# Patient Record
Sex: Female | Born: 1970 | Race: White | Hispanic: No | Marital: Married | Smoking: Former smoker
Health system: Southern US, Community
[De-identification: ages and names within clinical notes are randomized; demographics above are authoritative.]

---

## 2011-09-16 ENCOUNTER — Encounter: Payer: Self-pay | Admitting: Internal Medicine

## 2011-09-20 ENCOUNTER — Encounter: Payer: Self-pay | Admitting: Internal Medicine

## 2011-09-20 ENCOUNTER — Ambulatory Visit (INDEPENDENT_AMBULATORY_CARE_PROVIDER_SITE_OTHER): Payer: BC Managed Care – PPO | Admitting: Internal Medicine

## 2011-09-20 ENCOUNTER — Other Ambulatory Visit (HOSPITAL_COMMUNITY)
Admission: RE | Admit: 2011-09-20 | Discharge: 2011-09-20 | Disposition: A | Discharge status: Home / Self Care | Payer: BC Managed Care – PPO | Source: Ambulatory Visit | Attending: Internal Medicine | Admitting: Internal Medicine

## 2011-09-20 VITALS — BP 114/70 | HR 84 | Temp 98.2°F | Resp 16 | Ht 63.5 in | Wt 190.5 lb

## 2011-09-20 DIAGNOSIS — Z1239 Encounter for other screening for malignant neoplasm of breast: Secondary | ICD-10-CM

## 2011-09-20 DIAGNOSIS — Z124 Encounter for screening for malignant neoplasm of cervix: Secondary | ICD-10-CM

## 2011-09-20 DIAGNOSIS — Z113 Encounter for screening for infections with a predominantly sexual mode of transmission: Secondary | ICD-10-CM | POA: Insufficient documentation

## 2011-09-20 DIAGNOSIS — Z1159 Encounter for screening for other viral diseases: Secondary | ICD-10-CM | POA: Insufficient documentation

## 2011-09-20 DIAGNOSIS — F329 Major depressive disorder, single episode, unspecified: Secondary | ICD-10-CM

## 2011-09-20 DIAGNOSIS — R5383 Other fatigue: Secondary | ICD-10-CM

## 2011-09-20 DIAGNOSIS — Z13220 Encounter for screening for lipoid disorders: Secondary | ICD-10-CM

## 2011-09-20 DIAGNOSIS — Z01419 Encounter for gynecological examination (general) (routine) without abnormal findings: Secondary | ICD-10-CM | POA: Insufficient documentation

## 2011-09-20 MED ORDER — DESVENLAFAXINE SUCCINATE ER 50 MG PO TB24: 50.0000 mg | ORAL_TABLET | Freq: Every day | ORAL | Status: DC

## 2011-09-20 NOTE — Patient Instructions (Signed)
I am recommending that you start a trial of Pristiq,  One tablet daily for treatment of your depression and anxiety  Please try to add 20 minutes of exercise up to 5 times weekly  For your weight and for your stress  Level.  Your mammogram will be scheduled soon.  Please make an appt to return for a lab visit for fasting labs soon.

## 2011-09-20 NOTE — Progress Notes (Signed)
Subjective:    Patient ID: Holly Walls, female    DOB: [birth year], 40 y.o.   MRN: [id number]  HPI   Review of Systems     Objective:   Physical Exam        Assessment & Plan:   Subjective:     Holly Walls is a 40 y.o. female here for a routine exam.  Current complaints: Describes new onset emotional lability,  Aggravated by stress of job at [employer],  Around the time of her period is the worst.  Went to part time last summer because of the symptoms and has been seeing a Haematologist  But only twice so far. Mother recently diagnosed with cancer,  mother was a victim of domestic violence.  Has early mornign wakenings,  Goes back to sleep after 1 hours  Getting 8 hrs,  Has gained weight.  Due to overeating.  Working 30 hrs ,  Has 3 kids 77, 20, and 41 yr old. Does not exercise but has a treadmill today.  .  Personal health questionnaire reviewed: no.   Gynecologic History Patient's last menstrual period was 08/29/2011. Contraception: condoms Last Pap: 2009. Results were: normal Last mammogram: age 47. Results were: normal  Obstetric History OB History    Grav Para Term Preterm Abortions TAB SAB Ect Mult Living                   The following portions of the patient's history were reviewed and updated as appropriate: allergies, current medications, past family history, past medical history, past social history, past surgical history and problem list.  Review of Systems A comprehensive review of systems was negative except for: Behavioral/Psych: positive for depression and sleep disturbance    Objective:    BP 114/70  Pulse 84  Temp(Src) 98.2 F (36.8 C) (Oral)  Resp 16  Ht 5' 3.5" (1.613 m)  Wt 190 lb 8 oz (86.41 kg)  BMI 33.22 kg/m2  SpO2 100%  LMP 08/29/2011  General Appearance:    Alert, cooperative, no distress, appears stated age  Head:    Normocephalic, without obvious abnormality, atraumatic  Eyes:    PERRL, conjunctiva/corneas clear, EOM's  intact, fundi    benign, both eyes  Ears:    Normal TM's and external ear canals, both ears  Nose:   Nares normal, septum midline, mucosa normal, no drainage    or sinus tenderness  Throat:   Lips, mucosa, and tongue normal; teeth and gums normal  Neck:   Supple, symmetrical, trachea midline, no adenopathy;    thyroid:  no enlargement/tenderness/nodules; no carotid   bruit or JVD  Back:     Symmetric, no curvature, ROM normal, no CVA tenderness  Lungs:     Clear to auscultation bilaterally, respirations unlabored  Chest Wall:    No tenderness or deformity   Heart:    Regular rate and rhythm, S1 and S2 normal, no murmur, rub   or gallop  Breast Exam:    No tenderness, masses, or nipple abnormality  Abdomen:     Soft, non-tender, bowel sounds active all four quadrants,    no masses, no organomegaly  Genitalia:    Normal female without lesion, discharge or tenderness  Rectal:    Normal tone, normal prostate, no masses or tenderness;   guaiac negative stool  Extremities:   Extremities normal, atraumatic, no cyanosis or edema  Pulses:   2+ and symmetric all extremities  Skin:   Skin color, texture, turgor normal,  no rashes or lesions  Lymph nodes:   Cervical, supraclavicular, and axillary nodes normal  Neurologic:   CNII-XII intact, normal strength, sensation and reflexes    throughout      Assessment:    Healthy female exam.    Plan:    Mammogram ordered. Follow up in: 1 month.

## 2011-09-21 ENCOUNTER — Encounter: Payer: Self-pay | Admitting: Internal Medicine

## 2011-09-29 LAB — HM PAP SMEAR: HM Pap smear: NORMAL

## 2011-10-03 ENCOUNTER — Encounter: Payer: Self-pay | Admitting: *Deleted

## 2011-10-07 ENCOUNTER — Encounter: Payer: Self-pay | Admitting: Internal Medicine

## 2011-10-07 ENCOUNTER — Ambulatory Visit (INDEPENDENT_AMBULATORY_CARE_PROVIDER_SITE_OTHER): Payer: BC Managed Care – PPO | Admitting: Internal Medicine

## 2011-10-07 VITALS — BP 110/78 | HR 81 | Temp 98.5°F | Resp 18 | Ht 63.0 in | Wt 187.5 lb

## 2011-10-07 DIAGNOSIS — J019 Acute sinusitis, unspecified: Secondary | ICD-10-CM

## 2011-10-07 DIAGNOSIS — J329 Chronic sinusitis, unspecified: Secondary | ICD-10-CM

## 2011-10-07 LAB — POCT INFLUENZA A/B
Influenza A, POC: NEGATIVE
Influenza B, POC: NEGATIVE

## 2011-10-07 MED ORDER — BENZONATATE 200 MG PO CAPS: 200.0000 mg | ORAL_CAPSULE | Freq: Three times a day (TID) | ORAL | Status: AC | PRN

## 2011-10-07 MED ORDER — AMOXICILLIN-POT CLAVULANATE 875-125 MG PO TABS: 1.0000 | ORAL_TABLET | Freq: Two times a day (BID) | ORAL | Status: AC

## 2011-10-07 NOTE — Progress Notes (Signed)
  Subjective:    Patient ID: Holly Walls, female    DOB: 05/10/1971, 40 y.o.   MRN: [id number]  HPI   Holly Walls is a 40 yr Hispanic female who presents with a 5 day history of severe myalgias, sinus pressure without drainage,  Fevers to 100.3  and productive cough. No wheezing.  Using otc meds  with no significant relief. Daughter and husband are not sick.  Past Medical History  Diagnosis Date  . Asthma    Current Outpatient Prescriptions on File Prior to Visit  Medication Sig Dispense Refill  . albuterol (PROVENTIL HFA;VENTOLIN HFA) 108 (90 BASE) MCG/ACT inhaler Inhale 2 puffs into the lungs every 6 (six) hours as needed.        . desvenlafaxine (PRISTIQ) 50 MG 24 hr tablet Take 1 tablet (50 mg total) by mouth daily.  14 tablet  0    Review of Systems  Constitutional: Negative for fever, chills and unexpected weight change.  HENT: Negative for hearing loss, ear pain, nosebleeds, congestion, sore throat, facial swelling, rhinorrhea, sneezing, mouth sores, trouble swallowing, neck pain, neck stiffness, voice change, postnasal drip, sinus pressure, tinnitus and ear discharge.   Eyes: Negative for pain, discharge, redness and visual disturbance.  Respiratory: Negative for cough, chest tightness, shortness of breath, wheezing and stridor.   Cardiovascular: Negative for chest pain, palpitations and leg swelling.  Musculoskeletal: Negative for myalgias and arthralgias.  Skin: Negative for color change and rash.  Neurological: Negative for dizziness, weakness, light-headedness and headaches.  Hematological: Negative for adenopathy.       Objective:   Physical Exam  Constitutional: She is oriented to person, place, and time. She appears well-developed and well-nourished.  HENT:  Mouth/Throat: Oropharynx is clear and moist.  Eyes: EOM are normal. Pupils are equal, round, and reactive to light. No scleral icterus.  Neck: Normal range of motion. Neck supple. No JVD present. No thyromegaly  present.  Cardiovascular: Normal rate, regular rhythm, normal heart sounds and intact distal pulses.   Pulmonary/Chest: Effort normal and breath sounds normal.  Abdominal: Soft. Bowel sounds are normal. She exhibits no mass. There is no tenderness.  Musculoskeletal: Normal range of motion. She exhibits no edema.  Lymphadenopathy:    She has no cervical adenopathy.  Neurological: She is alert and oriented to person, place, and time.  Skin: Skin is warm and dry.  Psychiatric: She has a normal mood and affect.          Assessment & Plan:

## 2011-10-07 NOTE — Patient Instructions (Signed)
I am treating your for sinusitis with augmentin rx.  Take sudafed PE 10 mg every 6 hours for the congestion  Add tylenol, ibuprofen for  muscle aches and fevers.    Mucinex for thick secretions,  Delsym for cough..  There is a prescription for tessalomn cough tablets on file at your pharmacy if you need stronger cough medicine   Finish all your antibiotics.

## 2011-10-09 DIAGNOSIS — J019 Acute sinusitis, unspecified: Secondary | ICD-10-CM | POA: Insufficient documentation

## 2011-10-09 NOTE — Assessment & Plan Note (Signed)
She has no signs of asthma exacerbation or otitis on exam. Rapid influenza test was negative. Will treat for sinusitis.

## 2011-10-12 ENCOUNTER — Other Ambulatory Visit: Payer: BC Managed Care – PPO

## 2011-11-03 ENCOUNTER — Encounter: Payer: Self-pay | Admitting: Internal Medicine

## 2011-11-03 ENCOUNTER — Ambulatory Visit (INDEPENDENT_AMBULATORY_CARE_PROVIDER_SITE_OTHER): Payer: BC Managed Care – PPO | Admitting: Internal Medicine

## 2011-11-03 VITALS — BP 98/76 | HR 84 | Temp 97.8°F

## 2011-11-03 DIAGNOSIS — M545 Low back pain: Secondary | ICD-10-CM

## 2011-11-03 MED ORDER — PREDNISONE (PAK) 10 MG PO TABS: ORAL_TABLET | ORAL | Status: AC

## 2011-11-03 MED ORDER — HYDROCODONE-ACETAMINOPHEN 5-500 MG PO TABS: 1.0000 | ORAL_TABLET | Freq: Four times a day (QID) | ORAL | Status: DC | PRN

## 2011-11-03 MED ORDER — HYDROCODONE-ACETAMINOPHEN 5-500 MG PO TABS: 1.0000 | ORAL_TABLET | Freq: Four times a day (QID) | ORAL | Status: AC | PRN

## 2011-11-03 MED ORDER — ALBUTEROL SULFATE HFA 108 (90 BASE) MCG/ACT IN AERS: 2.0000 | INHALATION_SPRAY | Freq: Four times a day (QID) | RESPIRATORY_TRACT | Status: DC | PRN

## 2011-11-03 MED ORDER — METHOCARBAMOL 500 MG PO TABS: 500.0000 mg | ORAL_TABLET | Freq: Three times a day (TID) | ORAL | Status: AC

## 2011-11-03 NOTE — Progress Notes (Signed)
  Subjective:    Patient ID: Holly Walls, female    DOB: [birth year], 41 y.o.   MRN: [id number]  HPI    Subjective:    Holly Walls is a 40 y.o. female self-referred for evaluation and treatment of chronic low back pain. This is evaluated as a personal injury. The patient first noted symptoms 4 days ago. It was not related to a fall. The pain is rated moderate, and is located at the right sacroiliac area. The pain is described as stabbing and occurs all day. The symptoms are progressive. Symptoms are exacerbated by extension, lifting, sneezing, standing and twisting. Factors which relieve the pain include rest. Other associated symptoms include no other symptoms. Previous history of symptoms: never.  Treatment efforts have included none.  Outside reports reviewed: {one.  The following portions of the patient's history were reviewed and updated as appropriate: allergies, current medications, past family history, past medical history, past social history, past surgical history and problem list.  Review of Systems A comprehensive review of systems was negative.    Objective:     General :    alert, cooperative and appears stated age  Gait:  Normal. The patient can bear weight on the injured extremity.  Tenderness:   absent  Edema:   absent.  Back ROM:  leg lengths equal with no atrophy noted, flexion limited by pain to 60 degrees  Extension:   -10  Lateral Rotation:  Normal/Normal  Lateral Bending:   10/10  Leg Lengths:   Equal  Atrophy:    is absent  Pulses:  2+ and symmetric  Strength:  abductor 5/5; quadraceps 5/5; hamstrings 5/5; adductors 5/5; iliopsoas 5/5  Straight Leg Raise:  normal/normal  Patellar Reflexes:  2+ bilaterally  Ankle Reflexes:  2+ bilaterally   Imaging none      Assessment:    Low back pain, likely secondary to sacroiliac strain      Plan:    I discussed the following treatment options: Activity modifications discussed and recommended Medication  options discussed and recommended Questions solicited and answered Patient requested to return prn    Review of Systems     Objective:   Physical Exam        Assessment & Plan:  Sacroilieitis.  Right s/i joint

## 2011-11-03 NOTE — Patient Instructions (Signed)
You can take any combination of vicodin, robaxsin and prednisone.  DO NOT take alleve or advil with the prednisone.

## 2011-11-05 ENCOUNTER — Encounter: Payer: Self-pay | Admitting: Internal Medicine

## 2011-12-01 ENCOUNTER — Other Ambulatory Visit (INDEPENDENT_AMBULATORY_CARE_PROVIDER_SITE_OTHER): Payer: BC Managed Care – PPO | Admitting: *Deleted

## 2011-12-01 DIAGNOSIS — Z13220 Encounter for screening for lipoid disorders: Secondary | ICD-10-CM

## 2011-12-01 DIAGNOSIS — R5383 Other fatigue: Secondary | ICD-10-CM

## 2011-12-01 LAB — COMPREHENSIVE METABOLIC PANEL
ALT: 20 U/L (ref 0–35)
AST: 18 U/L (ref 0–37)
Albumin: 3.9 g/dL (ref 3.5–5.2)
Alkaline Phosphatase: 52 U/L (ref 39–117)
BUN: 14 mg/dL (ref 6–23)
Calcium: 9 mg/dL (ref 8.4–10.5)
Chloride: 105 mEq/L (ref 96–112)
Potassium: 4.2 mEq/L (ref 3.5–5.1)
Sodium: 139 mEq/L (ref 135–145)
Total Protein: 7.3 g/dL (ref 6.0–8.3)

## 2011-12-01 LAB — LIPID PANEL
Cholesterol: 138 mg/dL (ref 0–200)
LDL Cholesterol: 80 mg/dL (ref 0–99)
VLDL: 7.8 mg/dL (ref 0.0–40.0)

## 2012-01-16 LAB — HM MAMMOGRAPHY: HM Mammogram: NORMAL

## 2012-01-19 ENCOUNTER — Ambulatory Visit: Payer: Self-pay | Admitting: Internal Medicine

## 2012-02-03 ENCOUNTER — Encounter: Payer: Self-pay | Admitting: Internal Medicine

## 2013-01-14 ENCOUNTER — Other Ambulatory Visit: Payer: Self-pay | Admitting: Internal Medicine

## 2013-04-15 ENCOUNTER — Encounter: Payer: BC Managed Care – PPO | Admitting: Internal Medicine

## 2013-05-27 ENCOUNTER — Other Ambulatory Visit: Payer: Self-pay | Admitting: Internal Medicine

## 2013-05-27 NOTE — Telephone Encounter (Signed)
Last OV 1/13, do you want office visit prior to a refill?

## 2013-05-28 ENCOUNTER — Telehealth: Payer: Self-pay | Admitting: *Deleted

## 2013-05-28 MED ORDER — ALBUTEROL SULFATE HFA 108 (90 BASE) MCG/ACT IN AERS: INHALATION_SPRAY | RESPIRATORY_TRACT | Status: DC

## 2013-05-28 NOTE — Telephone Encounter (Signed)
Patient needs refill on ventolin inhaler no OV since 1/13 called and scheduled appointment for 06/18/13 can we fill inhaler?

## 2013-05-28 NOTE — Telephone Encounter (Signed)
Ok to refill,  Refill sent  

## 2013-05-29 NOTE — Telephone Encounter (Signed)
Left detailed message and reminded patient of appointment,

## 2013-06-10 NOTE — Telephone Encounter (Signed)
The albuterol whould not be withheld but the other can. Not sure which one you were referring to

## 2013-06-18 ENCOUNTER — Encounter: Payer: BC Managed Care – PPO | Admitting: Internal Medicine

## 2013-06-26 ENCOUNTER — Ambulatory Visit (INDEPENDENT_AMBULATORY_CARE_PROVIDER_SITE_OTHER): Payer: BC Managed Care – PPO | Admitting: Internal Medicine

## 2013-06-26 ENCOUNTER — Other Ambulatory Visit (HOSPITAL_COMMUNITY)
Admission: RE | Admit: 2013-06-26 | Discharge: 2013-06-26 | Disposition: A | Discharge status: Home / Self Care | Payer: BC Managed Care – PPO | Source: Ambulatory Visit | Attending: Internal Medicine | Admitting: Internal Medicine

## 2013-06-26 ENCOUNTER — Encounter: Payer: Self-pay | Admitting: Internal Medicine

## 2013-06-26 VITALS — BP 118/84 | HR 53 | Temp 99.0°F | Resp 14 | Ht 63.0 in | Wt 198.8 lb

## 2013-06-26 DIAGNOSIS — R87619 Unspecified abnormal cytological findings in specimens from cervix uteri: Secondary | ICD-10-CM

## 2013-06-26 DIAGNOSIS — E669 Obesity, unspecified: Secondary | ICD-10-CM

## 2013-06-26 DIAGNOSIS — Z111 Encounter for screening for respiratory tuberculosis: Secondary | ICD-10-CM

## 2013-06-26 DIAGNOSIS — Z01419 Encounter for gynecological examination (general) (routine) without abnormal findings: Secondary | ICD-10-CM | POA: Insufficient documentation

## 2013-06-26 DIAGNOSIS — Z201 Contact with and (suspected) exposure to tuberculosis: Secondary | ICD-10-CM

## 2013-06-26 DIAGNOSIS — Z23 Encounter for immunization: Secondary | ICD-10-CM

## 2013-06-26 DIAGNOSIS — Z1239 Encounter for other screening for malignant neoplasm of breast: Secondary | ICD-10-CM

## 2013-06-26 DIAGNOSIS — Z1151 Encounter for screening for human papillomavirus (HPV): Secondary | ICD-10-CM | POA: Insufficient documentation

## 2013-06-26 NOTE — Patient Instructions (Addendum)
You received your TDaP vaccine today   We placed a PPD (skin test to screen for exposure to TB) .  Please return on Friday afternoon to have it read    I want you to lose at least 10 lbs over the next 3 month using diet and exercise.  We will repeat your fasting labs when you return in 3 months   This is  My  version of a  "Low GI"  Diet:  It will still lower your blood sugars and allow you to lose 4 to 8  lbs  per month if you follow it carefully.  Your goal with exercise is a minimum of 30 minutes of aerobic exercise 5 days per week (Walking does not count once it becomes easy!)    All of the foods can be found at grocery stores and in bulk at [employer].  The Atkins protein bars and shakes are available in more varieties at Target, WalMart and [employer]'s Foods.     7 AM Breakfast:  Choose from the following:  Low carbohydrate Protein  Shakes (I recommend the EAS AdvantEdge "Carb Control" shakes  Or the low carb shakes by Atkins.    2.5 carbs   Arnold's "Sandwhich Thin"toasted  w/ peanut butter (no jelly: about 20 net carbs  "Bagel Thin" with cream cheese and salmon: about 20 carbs   a scrambled egg/bacon/cheese burrito made with Mission's "carb balance" whole wheat tortilla  (about 10 net carbs )   Avoid cereal and bananas, oatmeal and cream of wheat and grits. They are loaded with carbohydrates!   10 AM: high protein snack  Protein bar by Atkins (the snack size, under 200 cal, usually < 6 net carbs).    A stick of cheese:  Around 1 carb,  100 cal     Dannon Light n Fit [place] Yogurt  (80 cal, 8 carbs)  Other so called "protein bars" and Greek yogurts tend to be loaded with carbohydrates.  Remember, in food advertising, the word "energy" is synonymous for " carbohydrate."  Lunch:   A Sandwich using the bread choices listed, Can use any  Eggs,  lunchmeat, grilled meat or canned tuna), avocado, regular mayo/mustard  and cheese.  A Salad using blue cheese, ranch,  Goddess or vinagrette,  No  croutons or "confetti" and no "candied nuts" but regular nuts OK.   No pretzels or chips.  Pickles and miniature sweet peppers are a good low carb alternative that provide a "crunch"  The bread is the only source of carbohydrate in a sandwich and  can be decreased by trying some of these alternatives to traditional loaf bread  Joseph's makes a pita bread and a flat bread that are 50 cal and 4 net carbs available at BJs and WalMart.  This can be toasted to use with hummous as well  Toufayan makes a low carb flatbread that's 100 cal and 9 net carbs available at [employer] and [employer] makes 2 sizes of  Low carb whole wheat tortilla  (The large one is 210 cal and 6 net carbs) Avoid "Low fat dressings, as well as Reyne Dumas and [address] dressings They are loaded with sugar!   3 PM/ Mid day  Snack:  Consider  1 ounce of  almonds, walnuts, pistachios, pecans, peanuts,  Macadamia nuts or a nut medley.  Avoid "granola"; the dried cranberries and raisins are loaded with carbohydrates. Mixed nuts as long as there are no raisins,  cranberries  or dried fruit.     6 PM  Dinner:     Meat/fowl/fish with a green salad, and either broccoli, cauliflower, green beans, spinach, brussel sprouts or  Lima beans. DO NOT BREAD THE PROTEIN!!      There is a low carb pasta by Dreamfield's that is acceptable and tastes great: only 5 digestible carbs/serving.( All grocery stores but BJs carry it )  Try Kai Levins Angelo's chicken piccata or chicken or eggplant parm over low carb pasta.(Lowes and BJs)   Clifton Custard Sanchez's "Carnitas" (pulled pork, no sauce,  0 carbs) or his beef pot roast to make a dinner burrito (at BJ's)  Pesto over low carb pasta (bj's sells a good quality pesto in the center refrigerated section of the deli   Whole wheat pasta is still full of digestible carbs and  Not as low in glycemic index as Dreamfield's.   Brown rice is still rice,  So skip the rice and noodles if you eat [place] or [place] (or at  least limit to 1/2 cup)  9 PM snack :   Breyer's "low carb" fudgsicle or  ice cream bar (Carb Smart line), or  Weight Watcher's ice cream bar , or another "no sugar added" ice cream;  a serving of fresh berries/cherries with whipped cream   Cheese or DANNON'S LlGHT N FIT GREEK YOGURT  Avoid bananas, pineapple, grapes  and watermelon on a regular basis because they are high in sugar.  THINK OF THEM AS DESSERT  Remember that snack Substitutions should be less than 10 NET carbs per serving and meals < 20 carbs. Remember to subtract fiber grams to get the "net carbs."

## 2013-06-26 NOTE — Progress Notes (Signed)
Patient ID: Holly Walls, female   DOB: [birth year], 42 y.o.   MRN: [id number]     Subjective:     Holly Walls is a 42 y.o. female and is here for a comprehensive physical exam. The patient reports inability to lose weight .  History   Social History  . Marital Status: Married    Spouse Name: N/A    Number of Children: N/A  . Years of Education: N/A   Occupational History  . Not on file.   Social History Main Topics  . Smoking status: Former Games developer  . Smokeless tobacco: Never Used  . Alcohol Use: Yes  . Drug Use: No  . Sexual Activity: Yes    Birth Control/ Protection: Condom   Other Topics Concern  . Not on file   Social History Narrative  . No narrative on file   Health Maintenance  Topic Date Due  . Influenza Vaccine  05/31/2013  . Pap Smear  09/19/2014  . Tetanus/tdap  06/27/2023    The following portions of the patient's history were reviewed and updated as appropriate: allergies, current medications, past family history, past medical history, past social history, past surgical history and problem list.  Review of Systems A comprehensive review of systems was negative.   Objective:   BP 118/84  Pulse 53  Temp(Src) 99 F (37.2 C) (Oral)  Resp 14  Ht 5\' 3"  (1.6 m)  Wt 198 lb 12 oz (90.152 kg)  BMI 35.22 kg/m2  SpO2 99%  LMP 06/06/2013 General Appearance:    Alert, cooperative, no distress, appears stated age  Head:    Normocephalic, without obvious abnormality, atraumatic  Eyes:    PERRL, conjunctiva/corneas clear, EOM's intact, fundi    benign, both eyes  Ears:    Normal TM's and external ear canals, both ears  Nose:   Nares normal, septum midline, mucosa normal, no drainage    or sinus tenderness  Throat:   Lips, mucosa, and tongue normal; teeth and gums normal  Neck:   Supple, symmetrical, trachea midline, no adenopathy;    thyroid:  no enlargement/tenderness/nodules; no carotid   bruit or JVD  Back:     Symmetric, no curvature, ROM normal,  no CVA tenderness  Lungs:     Clear to auscultation bilaterally, respirations unlabored  Chest Wall:    No tenderness or deformity   Heart:    Regular rate and rhythm, S1 and S2 normal, no murmur, rub   or gallop  Breast Exam:    No tenderness, masses, or nipple abnormality.  Irregularly bordered mole on left breast  Abdomen:     Soft, non-tender, bowel sounds active all four quadrants,    no masses, no organomegaly  Genitalia:    Pelvic: cervix normal in appearance, external genitalia normal, no adnexal masses or tenderness, no cervical motion tenderness, rectovaginal septum normal, uterus normal size, shape, and consistency and vagina normal without malodorous discharge  Extremities:   Extremities normal, atraumatic, no cyanosis or edema  Pulses:   2+ and symmetric all extremities  Skin:   Skin color, texture, turgor normal, no rashes or lesions  Lymph nodes:   Cervical, supraclavicular, and axillary nodes normal  Neurologic:   CNII-XII intact, normal strength, sensation and reflexes    throughout     Assessment:   Abnormal Pap smear of cervix We are continuing annual Pap smears until she has had 3 consecutive normal results  Obesity, unspecified I have addressed  BMI and recommended wt loss  of 10% of body weigh over the next 6 months using a low glycemic index diet and regular exercise a minimum of 5 days per week.    Exposure to TB She has occupational exposure to undocumented aliens who have been reportedly diagnosed with TB. Screening PPD has been placed and she will return on Friday for reading of TB skin test.  We discussed that if her PPD was positive and chest x-ray were normal according to CDC guidelines she would receive 6-9 months of isoniazid therapy.    Updated Medication List Outpatient Encounter Prescriptions as of 06/26/2013  Medication Sig Dispense Refill  . albuterol (VENTOLIN HFA) 108 (90 BASE) MCG/ACT inhaler INHALE 2 PUFFS BY MOUTH EVERY 6 HOURS AS NEEDED  18 g   3  . [DISCONTINUED] desvenlafaxine (PRISTIQ) 50 MG 24 hr tablet Take 1 tablet (50 mg total) by mouth daily.  14 tablet  0   No facility-administered encounter medications on file as of 06/26/2013.

## 2013-06-28 ENCOUNTER — Encounter: Payer: Self-pay | Admitting: Internal Medicine

## 2013-06-28 DIAGNOSIS — Z201 Contact with and (suspected) exposure to tuberculosis: Secondary | ICD-10-CM | POA: Insufficient documentation

## 2013-06-28 DIAGNOSIS — E669 Obesity, unspecified: Secondary | ICD-10-CM | POA: Insufficient documentation

## 2013-06-28 NOTE — Assessment & Plan Note (Signed)
She has occupational exposure to undocumented aliens who have been reportedly diagnosed with TB. Screening PPD has been placed and she will return on Friday for reading of TB skin test.  We discussed that if her PPD was positive and chest x-ray were normal according to CDC guidelines she would receive 6-9 months of isoniazid therapy.

## 2013-06-28 NOTE — Assessment & Plan Note (Signed)
I have addressed  BMI and recommended wt loss of 10% of body weigh over the next 6 months using a low glycemic index diet and regular exercise a minimum of 5 days per week.   

## 2013-06-28 NOTE — Assessment & Plan Note (Signed)
We are continuing annual Pap smears until she has had 3 consecutive normal results

## 2013-07-03 ENCOUNTER — Encounter: Payer: Self-pay | Admitting: *Deleted

## 2013-09-18 ENCOUNTER — Ambulatory Visit: Payer: BC Managed Care – PPO | Admitting: Internal Medicine

## 2013-09-30 ENCOUNTER — Encounter (INDEPENDENT_AMBULATORY_CARE_PROVIDER_SITE_OTHER): Payer: Self-pay

## 2013-09-30 ENCOUNTER — Encounter: Payer: Self-pay | Admitting: Adult Health

## 2013-09-30 ENCOUNTER — Ambulatory Visit (INDEPENDENT_AMBULATORY_CARE_PROVIDER_SITE_OTHER): Payer: BC Managed Care – PPO | Admitting: Adult Health

## 2013-09-30 VITALS — BP 128/80 | HR 80 | Temp 97.8°F | Resp 14 | Wt 204.0 lb

## 2013-09-30 DIAGNOSIS — N390 Urinary tract infection, site not specified: Secondary | ICD-10-CM

## 2013-09-30 DIAGNOSIS — R300 Dysuria: Secondary | ICD-10-CM | POA: Insufficient documentation

## 2013-09-30 LAB — POCT URINALYSIS DIPSTICK
Bilirubin, UA: NEGATIVE
Glucose, UA: NEGATIVE
Nitrite, UA: NEGATIVE
Urobilinogen, UA: 0.2
pH, UA: 7

## 2013-09-30 MED ORDER — CIPROFLOXACIN HCL 250 MG PO TABS: 250.0000 mg | ORAL_TABLET | Freq: Two times a day (BID) | ORAL | Status: DC

## 2013-09-30 MED ORDER — FLUTICASONE PROPIONATE 50 MCG/ACT NA SUSP: 2.0000 | Freq: Every day | NASAL | Status: AC

## 2013-09-30 NOTE — Progress Notes (Signed)
   Subjective:    Patient ID: Holly Walls, female    DOB: 23-Jan-1971, 42 y.o.   MRN: [id number]  HPI  Pt is a pleasant 42 y/o female who presents to clinic with dysuria x 1-2 days. She denies fever or chills, hematuria   Review of Systems Positive for dysuria Negative for fever, chills, hematuria or flank pain    Objective:   Physical Exam  Constitutional: She is oriented to person, place, and time. No distress.  Cardiovascular: Normal rate and regular rhythm.   Pulmonary/Chest: Effort normal. No respiratory distress.  Abdominal: Soft.  Neurological: She is alert and oriented to person, place, and time.  Psychiatric: She has a normal mood and affect. Her behavior is normal. Thought content normal.          Assessment & Plan:

## 2013-09-30 NOTE — Progress Notes (Signed)
Pre visit review using our clinic review tool, if applicable. No additional management support is needed unless otherwise documented below in the visit note. 

## 2013-09-30 NOTE — Patient Instructions (Signed)
   The urinary tract is the group of organs in the body that handle urine. The urinary tract includes the:   Kidneys, two bean-shaped organs that filter the blood to make urine  Bladder, a balloon-shaped organ that stores urine  Ureters, two tubes that carry urine from the kidneys to the bladder  Urethra, the tube that carries urine from the bladder to the outside of the body   What are urinary tract infections? - Urinary tract infections, also called "UTIs," are infections that affect either the bladder or the kidneys. Bladder infections are more common than kidney infections. Bladder infections happen when bacteria get into the urethra and travel up into the bladder. Kidney infections happen when the bacteria travel even higher, up into the kidneys. Both bladder and kidney infections are more common in women than men.   What are the symptoms of a bladder infection? - The symptoms include:   Pain or a burning feeling when you urinate  The need to urinate often  The need to urinate suddenly or in a hurry  Blood in the urine   What are the symptoms of a kidney infection? - The symptoms of a kidney infection can include the symptoms of a bladder infection, but kidney infections can also cause:   Fever  Back pain  Nausea or vomiting   How are urinary tract infections treated? - Most urinary tract infections are treated with antibiotic pills. These pills work by killing the germs that cause the infection.  If you have a bladder infection, you will probably need to take antibiotics for 3 to 7 days. If you have a kidney infection, you will probably need to take antibiotics for longer-maybe for up to 2 weeks. If you have a kidney infection, it's also possible you will need to be treated in the hospital.   Your symptoms should begin to improve within a day of starting antibiotics. But you should finish all the antibiotic pills you get. Otherwise your infection might come back.  If needed, you  can also take a medicine to numb your bladder such as AZO or Urostat which are sold over the counter.   Can cranberry juice or other cranberry products prevent bladder infections? - The studies suggesting that cranberry products prevent bladder infections are not very good. But if you want to try cranberry products for this purpose, there is probably not much harm in doing so.    

## 2013-09-30 NOTE — Assessment & Plan Note (Signed)
Cipro 250 bid x 3 days.

## 2013-10-02 LAB — URINE CULTURE: Colony Count: >=100000

## 2014-01-17 ENCOUNTER — Ambulatory Visit (INDEPENDENT_AMBULATORY_CARE_PROVIDER_SITE_OTHER)
Admission: RE | Admit: 2014-01-17 | Discharge: 2014-01-17 | Disposition: A | Discharge status: Home / Self Care | Payer: BC Managed Care – PPO | Source: Ambulatory Visit | Attending: Adult Health | Admitting: Adult Health

## 2014-01-17 ENCOUNTER — Encounter (INDEPENDENT_AMBULATORY_CARE_PROVIDER_SITE_OTHER): Payer: Self-pay

## 2014-01-17 ENCOUNTER — Ambulatory Visit (INDEPENDENT_AMBULATORY_CARE_PROVIDER_SITE_OTHER): Payer: BC Managed Care – PPO | Admitting: Adult Health

## 2014-01-17 ENCOUNTER — Encounter: Payer: Self-pay | Admitting: Adult Health

## 2014-01-17 VITALS — BP 108/80 | HR 69 | Temp 98.7°F | Resp 14 | Wt 198.0 lb

## 2014-01-17 DIAGNOSIS — M79671 Pain in right foot: Secondary | ICD-10-CM

## 2014-01-17 DIAGNOSIS — M79609 Pain in unspecified limb: Secondary | ICD-10-CM

## 2014-01-17 NOTE — Progress Notes (Signed)
Patient ID: Holly Walls, female   DOB: [birth year], 43 y.o.   MRN: [id number]    Subjective:    Patient ID: Holly Brooklynracy Yepez, female    DOB: [birth year], 43 y.o.   MRN: [id number]  HPI Pt is a 43 y/o female who presents to clinic with right heel pain x 3 weeks. Pain is worse in the evening. Worse with flat shoes. Reports only hurts in a particular spot on her heel. No other pain. Does not hurt during elliptical. Hurts with walking.   Past Medical History  Diagnosis Date  . Asthma     Current Outpatient Prescriptions on File Prior to Visit  Medication Sig Dispense Refill  . albuterol (VENTOLIN HFA) 108 (90 BASE) MCG/ACT inhaler INHALE 2 PUFFS BY MOUTH EVERY 6 HOURS AS NEEDED  18 g  3  . fluticasone (FLONASE) 50 MCG/ACT nasal spray Place 2 sprays into both nostrils daily.  16 g  6   No current facility-administered medications on file prior to visit.     Review of Systems  Musculoskeletal:       Right heel pain  All other systems reviewed and are negative.       Objective:  BP 108/80  Pulse 69  Temp(Src) 98.7 F (37.1 C) (Oral)  Resp 14  Wt 198 lb (89.812 kg)  SpO2 97%   Physical Exam  Constitutional: She is oriented to person, place, and time. No distress.  Cardiovascular: Normal rate and regular rhythm.   Pulmonary/Chest: Effort normal. No respiratory distress.  Musculoskeletal: Normal range of motion. She exhibits tenderness. She exhibits no edema.  Pinpoint tenderness of the right heel. Pain with weight bearing. Worse by the end of the day. No other pain elsewhere on her foot.  Neurological: She is alert and oriented to person, place, and time.  Skin: Skin is warm and dry.  Psychiatric: She has a normal mood and affect. Her behavior is normal. Thought content normal.       Assessment & Plan:   1. Pain of right heel Pain is really not consistent with plantar fasciitis. ?bone spur. Will send for xray. She is allergic to ASA and does not take any ibuprofen either.  Has been taking tylenol. May need referral to podiatry. Will determine after xray.  - DG Foot Complete Right; Future

## 2014-01-17 NOTE — Progress Notes (Signed)
Pre visit review using our clinic review tool, if applicable. No additional management support is needed unless otherwise documented below in the visit note. 

## 2014-12-30 ENCOUNTER — Other Ambulatory Visit: Payer: Self-pay | Admitting: Internal Medicine

## 2015-02-13 ENCOUNTER — Emergency Department
Admit: 2015-02-13 | Disposition: A | Discharge status: Home / Self Care | Payer: Self-pay | Admitting: Emergency Medicine

## 2015-02-13 ENCOUNTER — Telehealth: Payer: Self-pay | Admitting: Internal Medicine

## 2015-02-13 LAB — CBC
HCT: 37.7 % (ref 35.0–47.0)
HGB: 12.2 g/dL (ref 12.0–16.0)
MCH: 27.4 pg (ref 26.0–34.0)
MCHC: 32.4 g/dL (ref 32.0–36.0)
MCV: 85 fL (ref 80–100)
PLATELETS: 257 10*3/uL (ref 150–440)
RBC: 4.45 10*6/uL (ref 3.80–5.20)
RDW: 14.2 % (ref 11.5–14.5)
WBC: 11.7 10*3/uL — AB (ref 3.6–11.0)

## 2015-02-13 LAB — URINALYSIS, COMPLETE
BLOOD: NEGATIVE
Bacteria: NONE SEEN
Bilirubin,UR: NEGATIVE
Glucose,UR: NEGATIVE mg/dL (ref 0–75)
Ketone: NEGATIVE
Nitrite: NEGATIVE
PH: 6 (ref 4.5–8.0)
Protein: NEGATIVE
Specific Gravity: 1.02 (ref 1.003–1.030)

## 2015-02-13 LAB — PRO B NATRIURETIC PEPTIDE: B-Type Natriuretic Peptide: 26 pg/mL

## 2015-02-13 LAB — BASIC METABOLIC PANEL
Anion Gap: 4 — ABNORMAL LOW (ref 7–16)
BUN: 8 mg/dL
CALCIUM: 8.9 mg/dL
Chloride: 105 mmol/L
Co2: 27 mmol/L
Creatinine: 0.59 mg/dL
EGFR (African American): >60
EGFR (Non-African Amer.): >60
GLUCOSE: 104 mg/dL — AB
Potassium: 3.5 mmol/L
SODIUM: 136 mmol/L

## 2015-02-13 LAB — PREGNANCY, URINE: PREGNANCY TEST, URINE: NEGATIVE m[IU]/mL

## 2015-02-13 LAB — TROPONIN I: Troponin-I: <0.03 ng/mL

## 2015-02-13 NOTE — Telephone Encounter (Signed)
Agree with ER disposition  Could be PE (blood clot in lung) ,  Gallbladder or pancreatitis

## 2015-02-13 NOTE — Telephone Encounter (Signed)
Patient Name: Holly Walls DOB: [birth year] Initial Comment Caller states woke up w/chest pain. SOB; Nurse Assessment Nurse: Elijah Birkaldwell, RN, Lynda Date/Time (Eastern Time): 02/13/2015 1:08:30 PM Confirm and document reason for call. If symptomatic, describe symptoms. ---Caller states she woke up w/chest pain on left side. SOB. It hurts more when she coughs or takes a deep breath. Has asthma/ allergies. Has the patient traveled out of the country within the last 30 days? ---Not Applicable Does the patient require triage? ---Yes Related visit to physician within the last 2 weeks? ---No Does the PT have any chronic conditions? (i.e. diabetes, asthma, etc.) ---Yes List chronic conditions. ---allergies, asthma Did the patient indicate they were pregnant? ---No Guidelines Guideline Title Affirmed Question Affirmed Notes Chest Pain Pain also present in shoulder(s) or arm(s) or jaw (Exception: pain is clearly made worse by movement) Final Disposition User Go to ED Now Elijah Birkaldwell, RN, Lynda Comments Caller states her chest pain is sharp at times & goes in to her shoulder as well. Asked about going to an UC, but nurse strongly advised that she be seen ASAP in the ER.

## 2015-02-13 NOTE — Telephone Encounter (Signed)
FYI

## 2015-04-24 ENCOUNTER — Other Ambulatory Visit: Payer: Self-pay | Admitting: Family Medicine

## 2015-04-24 DIAGNOSIS — Z1231 Encounter for screening mammogram for malignant neoplasm of breast: Secondary | ICD-10-CM

## 2015-05-12 ENCOUNTER — Ambulatory Visit: Payer: Self-pay

## 2015-05-12 ENCOUNTER — Ambulatory Visit
Admission: RE | Admit: 2015-05-12 | Discharge: 2015-05-12 | Disposition: A | Discharge status: Home / Self Care | Payer: No Typology Code available for payment source | Source: Ambulatory Visit | Attending: Family Medicine | Admitting: Family Medicine

## 2015-05-12 DIAGNOSIS — Z1231 Encounter for screening mammogram for malignant neoplasm of breast: Secondary | ICD-10-CM | POA: Insufficient documentation

## 2015-05-23 ENCOUNTER — Encounter: Payer: Self-pay | Admitting: Emergency Medicine

## 2015-05-23 ENCOUNTER — Emergency Department
Admission: EM | Admit: 2015-05-23 | Discharge: 2015-05-23 | Disposition: A | Discharge status: Home / Self Care | Payer: No Typology Code available for payment source | Attending: Emergency Medicine | Admitting: Emergency Medicine

## 2015-05-23 DIAGNOSIS — J029 Acute pharyngitis, unspecified: Secondary | ICD-10-CM | POA: Diagnosis not present

## 2015-05-23 DIAGNOSIS — Z87891 Personal history of nicotine dependence: Secondary | ICD-10-CM | POA: Insufficient documentation

## 2015-05-23 DIAGNOSIS — H6091 Unspecified otitis externa, right ear: Secondary | ICD-10-CM | POA: Insufficient documentation

## 2015-05-23 DIAGNOSIS — Z792 Long term (current) use of antibiotics: Secondary | ICD-10-CM | POA: Diagnosis not present

## 2015-05-23 DIAGNOSIS — H9201 Otalgia, right ear: Secondary | ICD-10-CM | POA: Diagnosis present

## 2015-05-23 MED ORDER — CIPROFLOXACIN HCL 500 MG PO TABS
500.0000 mg | ORAL_TABLET | Freq: Once | ORAL | Status: AC
  Administered 2015-05-23: 500 mg via ORAL
  Filled 2015-05-23: qty 1

## 2015-05-23 MED ORDER — PENICILLIN G BENZATHINE 1200000 UNIT/2ML IM SUSP
1.2000 10*6.[IU] | Freq: Once | INTRAMUSCULAR | Status: AC
  Administered 2015-05-23: 1.2 10*6.[IU] via INTRAMUSCULAR
  Filled 2015-05-23: qty 2

## 2015-05-23 MED ORDER — TRAMADOL HCL 50 MG PO TABS
50.0000 mg | ORAL_TABLET | Freq: Once | ORAL | Status: AC
  Administered 2015-05-23: 50 mg via ORAL
  Filled 2015-05-23: qty 1

## 2015-05-23 MED ORDER — CIPROFLOXACIN HCL 500 MG PO TABS: 500.0000 mg | ORAL_TABLET | Freq: Four times a day (QID) | ORAL | Status: AC

## 2015-05-23 MED ORDER — TRAMADOL HCL 50 MG PO TABS: 50.0000 mg | ORAL_TABLET | Freq: Four times a day (QID) | ORAL | Status: AC | PRN

## 2015-05-23 NOTE — Discharge Instructions (Signed)
Draining Ear Ear wax, pus, blood and other fluids are examples of the different types of drainage from ears. Drops or cream may be needed to lessen the itching which may occur with ear drainage. CAUSES   Skin irritations in the ear.  Ear infection.  Swimmer's ear.  Ruptured eardrum.  Foreign object in the ear canal.  Sudden pressure changes.  Head injury. HOME CARE INSTRUCTIONS   Only take over-the-counter or prescription medicines for pain, fever, or discomfort as directed by your caregiver.  Do not rub the ear canal with cotton-tipped swabs.  Do not swim until your caregiver says it is okay.  Before you take a shower, cover a cotton ball with petroleum jelly to keep water out.  Limit exposure to smoke. Secondhand smoke can increase the chance for ear infections.  Keep up with immunizations.  Wash your hands well.  Keep all follow-up appointments to examine the ear and evaluate hearing. SEEK MEDICAL CARE IF:   You have increased drainage.  You have ear pain, a fever, or drainage that is not getting better after 48 hours of antibiotics.  You are unusually tired. SEEK IMMEDIATE MEDICAL CARE IF:  You have severe ear pain or headache.  The patient is older than 3 months with a rectal or oral temperature of 102 F (38.9 C) or higher.  The patient is 49 months old or younger with a rectal temperature of 100.4 F (38 C) or higher.  You vomit.  You feel dizzy.  You have a seizure.  You have new hearing loss. MAKE SURE YOU:   Understand these instructions.  Will watch your condition.  Will get help right away if you are not doing well or get worse. Document Released: 10/17/2005 Document Revised: 01/09/2012 Document Reviewed: 08/20/2009 [facility] Patient Information 2015 [place], [place]. This information is not intended to replace advice given to you by your health care provider. Make sure you discuss any questions you have with your health care  provider.  Otitis Externa Otitis externa is a bacterial or fungal infection of the outer ear canal. This is the area from the eardrum to the outside of the ear. Otitis externa is sometimes called "swimmer's ear." CAUSES  Possible causes of infection include:  Swimming in dirty water.  Moisture remaining in the ear after swimming or bathing.  Mild injury (trauma) to the ear.  Objects stuck in the ear (foreign body).  Cuts or scrapes (abrasions) on the outside of the ear. SIGNS AND SYMPTOMS  The first symptom of infection is often itching in the ear canal. Later signs and symptoms may include swelling and redness of the ear canal, ear pain, and yellowish-white fluid (pus) coming from the ear. The ear pain may be worse when pulling on the earlobe. DIAGNOSIS  Your health care provider will perform a physical exam. A sample of fluid may be taken from the ear and examined for bacteria or fungi. TREATMENT  Antibiotic ear drops are often given for 10 to 14 days. Treatment may also include pain medicine or corticosteroids to reduce itching and swelling. HOME CARE INSTRUCTIONS   Apply antibiotic ear drops to the ear canal as prescribed by your health care provider.  Take medicines only as directed by your health care provider.  If you have diabetes, follow any additional treatment instructions from your health care provider.  Keep all follow-up visits as directed by your health care provider. PREVENTION   Keep your ear dry. Use the corner of a towel to absorb  water out of the ear canal after swimming or bathing.  Avoid scratching or putting objects inside your ear. This can damage the ear canal or remove the protective wax that lines the canal. This makes it easier for bacteria and fungi to grow.  Avoid swimming in lakes, polluted water, or poorly chlorinated pools.  You may use ear drops made of rubbing alcohol and vinegar after swimming. Combine equal parts of white vinegar and alcohol  in a bottle. Put 3 or 4 drops into each ear after swimming. SEEK MEDICAL CARE IF:   You have a fever.  Your ear is still red, swollen, painful, or draining pus after 3 days.  Your redness, swelling, or pain gets worse.  You have a severe headache.  You have redness, swelling, pain, or tenderness in the area behind your ear. MAKE SURE YOU:   Understand these instructions.  Will watch your condition.  Will get help right away if you are not doing well or get worse. Document Released: 10/17/2005 Document Revised: 03/03/2014 Document Reviewed: 11/03/2011 [facility] Patient Information 2015 [place], [place]. This information is not intended to replace advice given to you by your health care provider. Make sure you discuss any questions you have with your health care provider.  Sore Throat A sore throat is pain, burning, irritation, or scratchiness of the throat. There is often pain or tenderness when swallowing or talking. A sore throat may be accompanied by other symptoms, such as coughing, sneezing, fever, and swollen neck glands. A sore throat is often the first sign of another sickness, such as a cold, flu, strep throat, or mononucleosis (commonly known as mono). Most sore throats go away without medical treatment. CAUSES  The most common causes of a sore throat include:  A viral infection, such as a cold, flu, or mono.  A bacterial infection, such as strep throat, tonsillitis, or whooping cough.  Seasonal allergies.  Dryness in the air.  Irritants, such as smoke or pollution.  Gastroesophageal reflux disease (GERD). HOME CARE INSTRUCTIONS   Only take over-the-counter medicines as directed by your caregiver.  Drink enough fluids to keep your urine clear or pale yellow.  Rest as needed.  Try using throat sprays, lozenges, or sucking on hard candy to ease any pain (if older than 4 years or as directed).  Sip warm liquids, such as broth, herbal tea, or warm water with honey  to relieve pain temporarily. You may also eat or drink cold or frozen liquids such as frozen ice pops.  Gargle with salt water (mix 1 tsp salt with 8 oz of water).  Do not smoke and avoid secondhand smoke.  Put a cool-mist humidifier in your bedroom at night to moisten the air. You can also turn on a hot shower and sit in the bathroom with the door closed for 5-10 minutes. SEEK IMMEDIATE MEDICAL CARE IF:  You have difficulty breathing.  You are unable to swallow fluids, soft foods, or your saliva.  You have increased swelling in the throat.  Your sore throat does not get better in 7 days.  You have nausea and vomiting.  You have a fever or persistent symptoms for more than 2-3 days.  You have a fever and your symptoms suddenly get worse. MAKE SURE YOU:   Understand these instructions.  Will watch your condition.  Will get help right away if you are not doing well or get worse. Document Released: 11/24/2004 Document Revised: 10/03/2012 Document Reviewed: 06/24/2012 [facility] Patient Information 2015 [place]. This information is not intended to replace advice given to you by your health care provider. Make sure you discuss any questions you have with your health care provider.

## 2015-05-23 NOTE — ED Notes (Signed)
AAOx3.  Skin warm and dry.  NAD.  D/C home with parents. 

## 2015-05-23 NOTE — ED Provider Notes (Signed)
CSN: [id number]     Arrival date & time 05/23/15  2049 History   First MD Initiated Contact with Patient 05/23/15 2219     Chief Complaint  Patient presents with  . Otalgia    Pt presents to ER alert and in NAD. Pt has been seen for ear infx and reports right ear pain is worse.     (Consider location/radiation/quality/duration/timing/severity/associated sxs/prior Treatment) HPI  44 year old female presents to the emergency department for evaluation of right ear and throat pain. Patient was recently treated for otitis externa with Ciprodex. She has not had much relief with ear drops. Patient's pain is within the right ear canal she has had mild drainage. Also has had 3-4 days of sore throat with headache. She has swollen lymph nodes on the right anterior neck. Tolerates by mouth well. She denies any fevers chest pain shortness of breath or abdominal pain.  Past Medical History  Diagnosis Date  . Asthma    History reviewed. No pertinent past surgical history. Family History  Problem Relation Age of Onset  . Hypertension Mother   . Mental illness Mother   . Arthritis Mother   . Cancer Mother 4    Breast Cancer  . Breast cancer Mother 20  . Diabetes Maternal Grandmother   . Diabetes Maternal Grandfather   . Stroke Paternal Grandmother     ruptured berry aneurysm  . Alcohol abuse Father   . Arthritis Brother     rheumatoid arthritis?  avoids doctors    History  Substance Use Topics  . Smoking status: Former Games developer  . Smokeless tobacco: Never Used  . Alcohol Use: Yes   OB History    No data available     Review of Systems   Constitutional: Negative for fever, chills, activity change and fatigue.  HENT: Positive for ear discharge, ear pain and sore throat. Negative for congestion and sinus pressure.   Eyes: Negative for visual disturbance.  Respiratory: Negative for cough, chest tightness and shortness of breath.   Cardiovascular: Negative for chest pain and leg swelling.  Gastrointestinal: Negative for nausea, vomiting, abdominal pain and diarrhea.  Genitourinary: Negative for dysuria.  Musculoskeletal: Negative for arthralgias and gait problem.  Skin: Negative for rash.  Neurological: Negative for weakness, numbness and headaches.  Hematological: Negative for adenopathy.  Psychiatric/Behavioral: Negative for behavioral problems, confusion and agitation.      Allergies  Aspirin  Home Medications   Prior to Admission medications   Medication Sig Start Date End Date Taking? Authorizing Provider  ciprofloxacin (CIPRO) 500 MG tablet Take 1 tablet (500 mg total) by mouth 4 (four) times daily. 05/23/15 05/26/15  Evon Slack, PA-C  fluticasone (FLONASE) 50 MCG/ACT nasal spray Place 2 sprays into both nostrils daily. 09/30/13   Raquel Conni Elliot, NP  traMADol (ULTRAM) 50 MG tablet Take 1 tablet (50 mg total) by mouth every 6 (six) hours as needed. 05/23/15   Evon Slack, PA-C  VENTOLIN HFA 108 (90 BASE) MCG/ACT inhaler INHALE 2 PUFFS BY MOUTH EVERY 6 HOURS AS NEEDED 12/30/14   Sherlene Shams, MD   BP 148/75 mmHg  Pulse 92  Temp(Src) 98.7 F (37.1 C) (Oral)  Ht 5\' 3"  (1.6 m)  Wt 200 lb (90.719 kg)  BMI 35.44 kg/m2  SpO2 98%  LMP 04/28/2015 Physical Exam  Constitutional: She is oriented to person, place, and time. She appears well-developed and well-nourished. No distress.  HENT:  Head: Normocephalic and atraumatic.  Right Ear: Tympanic membrane normal. No lacerations. There is drainage and swelling (external canal). No foreign bodies. No mastoid tenderness. Tympanic membrane is not injected, not  perforated and not retracted. No middle ear effusion.  Left Ear: Hearing, tympanic membrane, external ear and ear canal normal.  Mouth/Throat: No oral lesions. No trismus in the jaw. No uvula swelling. Oropharyngeal exudate (right), posterior oropharyngeal edema (right) and posterior oropharyngeal erythema present. No tonsillar abscesses.  Right external ear with mild erythema and no tenderness to palpation along the pinna. No warmth or fluctuance.  Eyes: EOM are normal. Pupils are equal, round, and reactive to light. Right eye exhibits no discharge. Left eye exhibits no discharge.  Neck: Normal range of motion. Neck supple.  Cardiovascular: Normal rate, regular rhythm and intact distal pulses.   Pulmonary/Chest: Effort normal and breath sounds normal. No respiratory distress. She exhibits no tenderness.  Abdominal: Soft. She exhibits no distension. There is  no tenderness.  Musculoskeletal: Normal range of motion. She exhibits no edema.  Neurological: She is alert and oriented to person, place, and time. She has normal reflexes.  Skin: Skin is warm and dry.  Psychiatric: She has a normal mood and affect. Her behavior is normal. Thought content normal.    ED Course  Procedures (including critical care time) Labs Review Labs Reviewed - No data to display  Imaging Review No results found.   EKG Interpretation None      MDM   Final diagnoses:  Otitis externa of right ear  Pharyngitis    44 year old female with 4 day history of right ear otitis externa. She was recently started with Ciprodex and has seen no improvement. She also has several day history of right tonsillar pain and swelling. Exam shows erythema with exudates. He has tender anterior cervical lymphadenopathy. No cough. Patient is treated with penicillin G IM 1.2 million units for pharyngitis. She was then given ciprofloxacin oral tablets to help with right ear otitis externa that has not improved with  Ciprodex.    Evon Slack, PA-C 05/23/15 1478  Jene Every, MD 05/23/15 [phone]

## 2015-05-23 NOTE — ED Notes (Signed)
Pt presents to ER alert and in NAD. Pt has been seen for ear infx and reports right ear pain is worse.

## 2015-05-23 NOTE — ED Notes (Signed)
AAOx3.  Skin warm and dry.  NAD 

## 2015-05-24 NOTE — ED Provider Notes (Signed)
Discharge nurse received a phone call from pharmacy that patient's prescription was written for 500 mg Cipro 4 times a day for one week and they were questioning this dosage.  I did review the chart from the physician assistant Amador Cunas. He documented that he was intending to treat with Cipro by mouth systemically due to failure of otitis externa to Ciprodex eardrops in addition to other systemic symptoms.  It appears that his intent was to write for a by mouth prescription of Cipro for 1 week and so the dose was changed from 4 times daily to 2 times daily.  Governor Rooks, MD 05/24/15 1024

## 2016-01-04 ENCOUNTER — Telehealth: Payer: Self-pay | Admitting: Internal Medicine

## 2016-01-04 NOTE — Telephone Encounter (Signed)
Immunization record printed for pt to p/u

## 2016-01-04 NOTE — Telephone Encounter (Signed)
Pt called about needing her immunization record for her job. Her current provider only has the immunization that they gave her. Pt needs whatever we have. Pt was once a pt of Dr Darrick Huntsmanullo. Call pt to pick up @ [phone]. Thank you!

## 2016-09-27 ENCOUNTER — Other Ambulatory Visit: Payer: Self-pay | Admitting: Family Medicine

## 2016-10-04 ENCOUNTER — Other Ambulatory Visit: Payer: Self-pay | Admitting: Family Medicine

## 2016-10-04 DIAGNOSIS — Z1231 Encounter for screening mammogram for malignant neoplasm of breast: Secondary | ICD-10-CM

## 2016-10-14 ENCOUNTER — Ambulatory Visit
Admission: RE | Admit: 2016-10-14 | Discharge: 2016-10-14 | Disposition: A | Discharge status: Home / Self Care | Payer: BLUE CROSS/BLUE SHIELD | Source: Ambulatory Visit | Attending: Family Medicine | Admitting: Family Medicine

## 2016-10-14 DIAGNOSIS — Z1231 Encounter for screening mammogram for malignant neoplasm of breast: Secondary | ICD-10-CM | POA: Diagnosis not present

## 2017-12-15 ENCOUNTER — Other Ambulatory Visit: Payer: Self-pay | Admitting: Family Medicine

## 2017-12-15 DIAGNOSIS — Z1231 Encounter for screening mammogram for malignant neoplasm of breast: Secondary | ICD-10-CM

## 2018-01-01 ENCOUNTER — Ambulatory Visit
Admission: RE | Admit: 2018-01-01 | Discharge: 2018-01-01 | Disposition: A | Discharge status: Home / Self Care | Payer: BC Managed Care – PPO | Source: Ambulatory Visit | Attending: Family Medicine | Admitting: Family Medicine

## 2018-01-01 DIAGNOSIS — Z1231 Encounter for screening mammogram for malignant neoplasm of breast: Secondary | ICD-10-CM | POA: Diagnosis present

## 2019-05-31 ENCOUNTER — Other Ambulatory Visit: Payer: Self-pay | Admitting: Medical Oncology

## 2019-05-31 DIAGNOSIS — Z1231 Encounter for screening mammogram for malignant neoplasm of breast: Secondary | ICD-10-CM

## 2019-07-04 ENCOUNTER — Ambulatory Visit
Admission: RE | Admit: 2019-07-04 | Discharge: 2019-07-04 | Disposition: A | Discharge status: Home / Self Care | Payer: BC Managed Care – PPO | Source: Ambulatory Visit | Attending: Medical Oncology | Admitting: Medical Oncology

## 2019-07-04 DIAGNOSIS — Z1231 Encounter for screening mammogram for malignant neoplasm of breast: Secondary | ICD-10-CM | POA: Insufficient documentation

## 2019-07-09 ENCOUNTER — Other Ambulatory Visit: Payer: Self-pay | Admitting: Medical Oncology

## 2019-07-09 DIAGNOSIS — R921 Mammographic calcification found on diagnostic imaging of breast: Secondary | ICD-10-CM

## 2019-07-09 DIAGNOSIS — R928 Other abnormal and inconclusive findings on diagnostic imaging of breast: Secondary | ICD-10-CM

## 2019-07-15 ENCOUNTER — Ambulatory Visit
Admission: RE | Admit: 2019-07-15 | Discharge: 2019-07-15 | Disposition: A | Discharge status: Home / Self Care | Payer: BC Managed Care – PPO | Source: Ambulatory Visit | Attending: Medical Oncology | Admitting: Medical Oncology

## 2019-07-15 DIAGNOSIS — R921 Mammographic calcification found on diagnostic imaging of breast: Secondary | ICD-10-CM | POA: Insufficient documentation

## 2019-07-15 DIAGNOSIS — R928 Other abnormal and inconclusive findings on diagnostic imaging of breast: Secondary | ICD-10-CM

## 2019-07-17 ENCOUNTER — Other Ambulatory Visit: Payer: Self-pay | Admitting: Medical Oncology

## 2019-07-17 DIAGNOSIS — R928 Other abnormal and inconclusive findings on diagnostic imaging of breast: Secondary | ICD-10-CM

## 2019-07-17 DIAGNOSIS — R921 Mammographic calcification found on diagnostic imaging of breast: Secondary | ICD-10-CM

## 2019-07-22 ENCOUNTER — Ambulatory Visit
Admission: RE | Admit: 2019-07-22 | Discharge: 2019-07-22 | Disposition: A | Discharge status: Home / Self Care | Payer: BC Managed Care – PPO | Source: Ambulatory Visit | Attending: Medical Oncology | Admitting: Medical Oncology

## 2019-07-22 DIAGNOSIS — R928 Other abnormal and inconclusive findings on diagnostic imaging of breast: Secondary | ICD-10-CM

## 2019-07-22 DIAGNOSIS — R921 Mammographic calcification found on diagnostic imaging of breast: Secondary | ICD-10-CM

## 2019-07-22 HISTORY — PX: BREAST BIOPSY: SHX20

## 2019-07-24 ENCOUNTER — Other Ambulatory Visit: Payer: Self-pay | Admitting: Specialist

## 2019-07-24 ENCOUNTER — Encounter: Payer: Self-pay | Admitting: *Deleted

## 2019-07-24 LAB — SURGICAL PATHOLOGY

## 2019-07-25 NOTE — Progress Notes (Signed)
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  Patient is aware of her diagnosis and states she would like to go to [facility].  States that's where her mother was treated.  Informed patient I would notify her PCP at [facility] to make that referral for her.  I spoke to Sandria Senter, RN at [facility], of patient's desire to be referred to [facility].  Their office will take care of the referral.  Patient was informed she could call back with any questions or if she chooses to have her care here. She is agreeable.

## 2020-01-12 IMAGING — MG MM BREAST BX W LOC DEV 1ST LESION IMAGE BX SPEC STEREO GUIDE*L*
8 of 13 series · 8 of 25 positions shown · non-contrast
Comparison: Previous exams.
COMPARISON: Previous exams.

Addendum:
CLINICAL DATA: Patient presents for stereotactic core needle biopsy
of a 1.8 cm group of indeterminate microcalcifications over the left
upper outer quadrant.

EXAM:
LEFT BREAST STEREOTACTIC CORE NEEDLE BIOPSY

[L (1 of 8)]
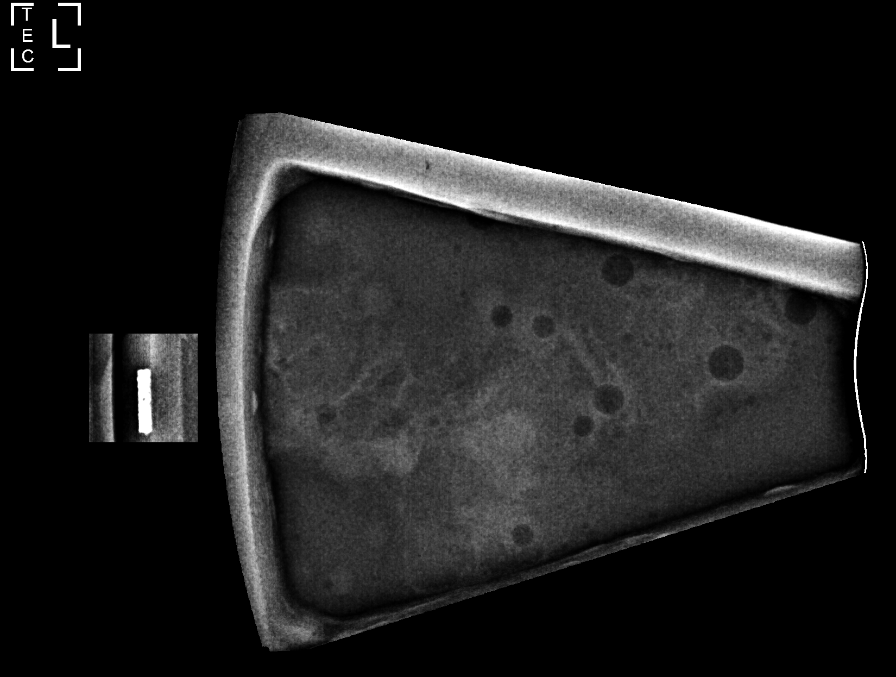

[L (2 of 8)]
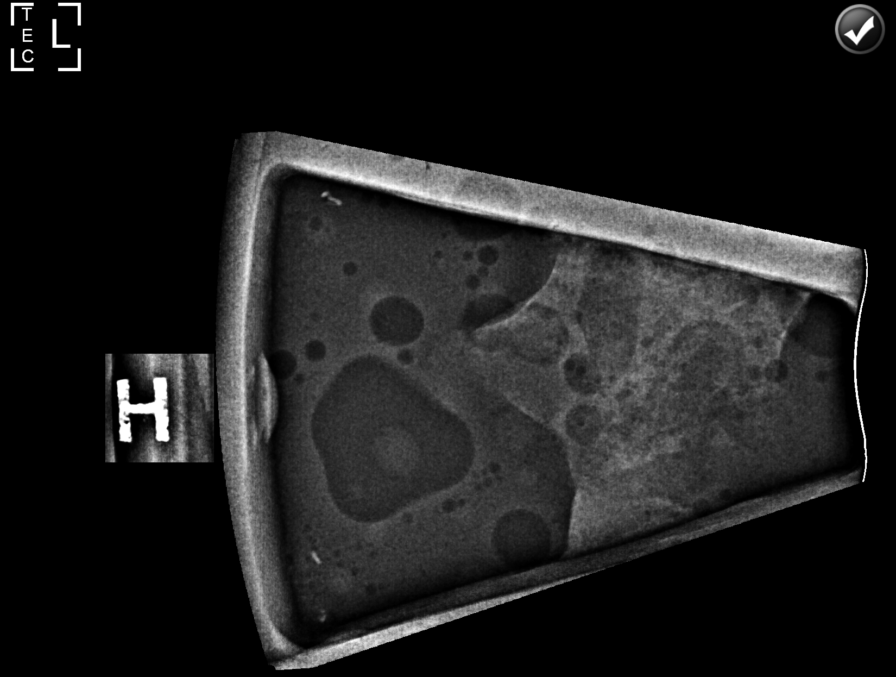

[L (3 of 8)]
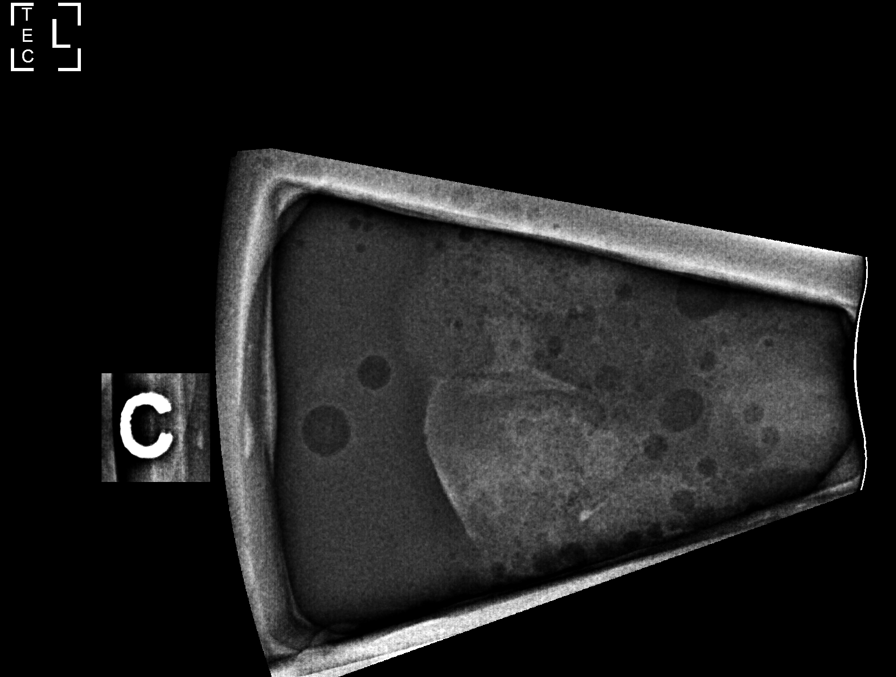

[L (4 of 8)]
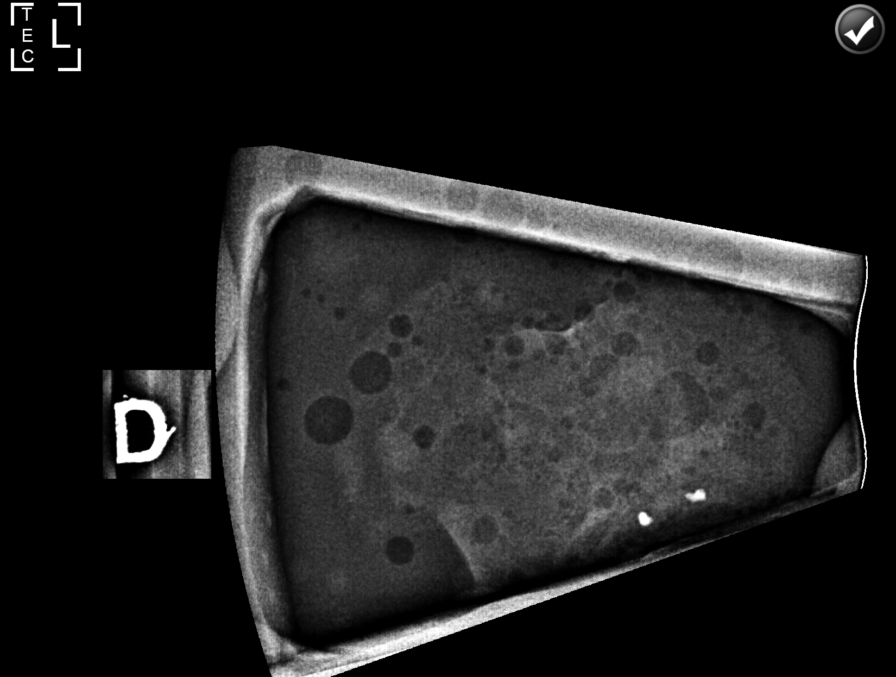

[L (5 of 8)]
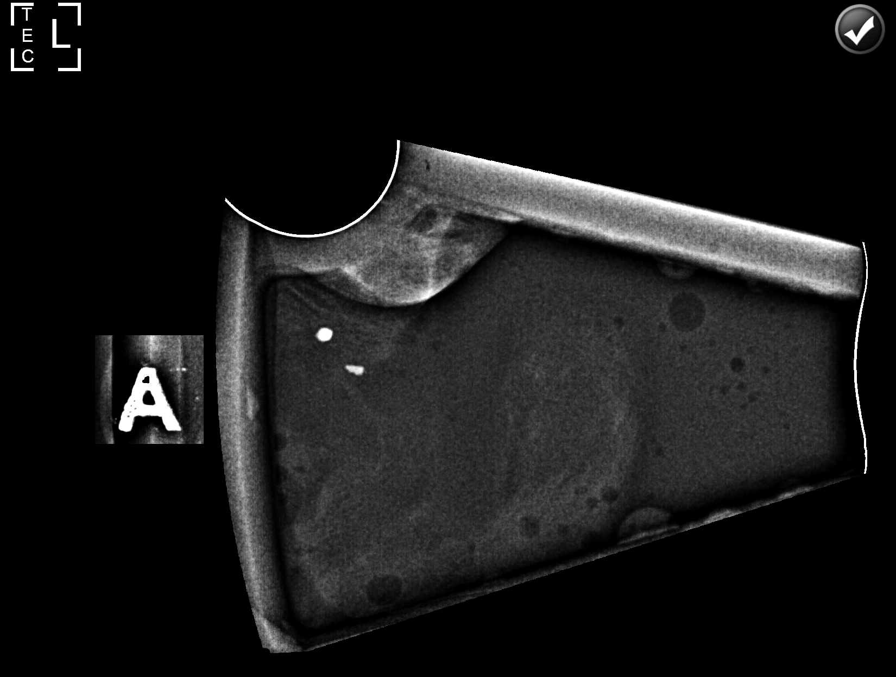

[L (6 of 8)]
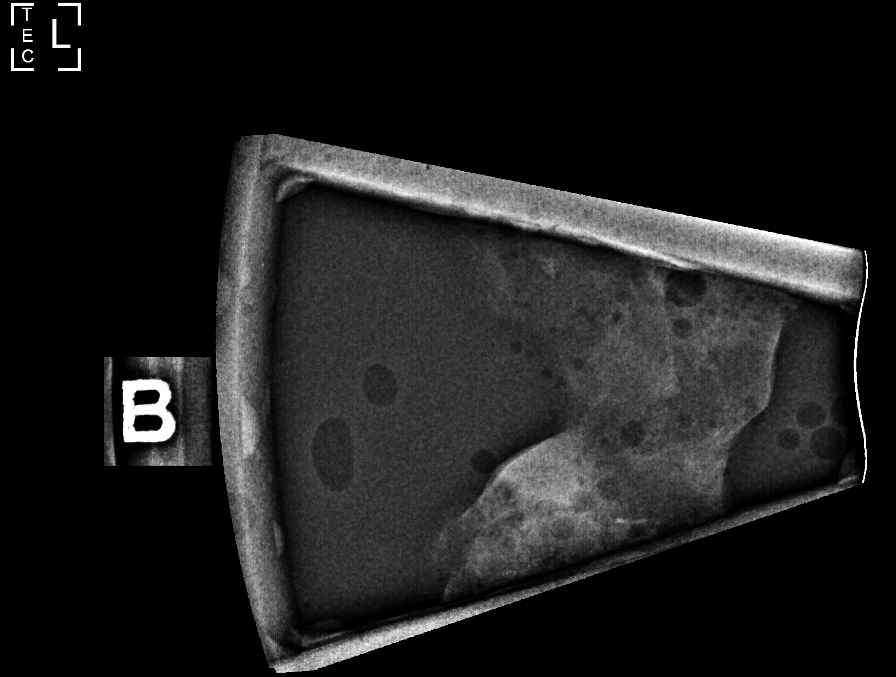

[L (7 of 8)]
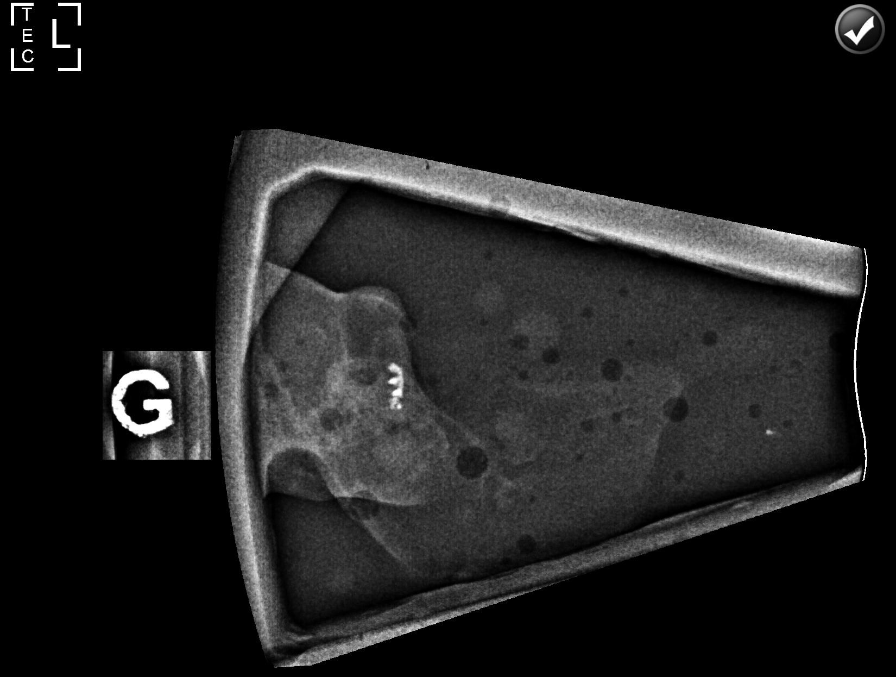

[L (8 of 8)]
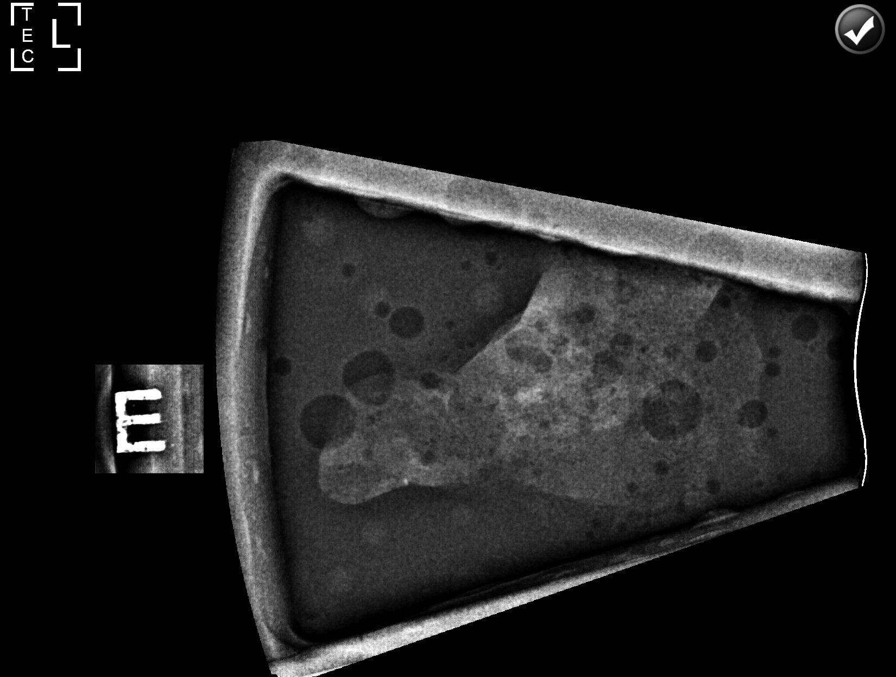

[8 of 25 positions shown; findings below may reference images not displayed]



Using sterile technique and 1% Lidocaine as local anesthetic, under
stereotactic guidance, a 9 gauge vacuum assisted device was used to
perform core needle biopsy of the targeted microcalcifications over
the left upper outer quadrant using a lateral to medial approach.
Specimen radiograph was performed showing multiple of the targeted
microcalcifications. Specimens with calcifications are identified
for pathology.

Lesion quadrant: Left upper outer quadrant.

At the conclusion of the procedure, a X shaped tissue marker clip
was deployed into the biopsy cavity. Follow-up 2-view mammogram was
performed and dictated separately.
IMPRESSION: Stereotactic-guided biopsy of indeterminate left breast
microcalcifications. No apparent complications.

ADDENDUM:
PATHOLOGY revealed: A. BREAST, LEFT UPPER OUTER QUADRANT;
STEREOTACTIC BIOPSIES: - HIGH-GRADE DUCTAL CARCINOMA IN SITU
ASSOCIATED WITH NECROSIS AND CALCIFICATIONS.

COMMENT. The foci of high-grade ductal carcinoma in situ are
scattered in 5 of 7 blocks and the largest focus measures 3 mm in
greatest dimension. Some of these foci are associated with coarse
ductal calcifications and necrosis (comedocarcinoma). No invasive
carcinoma is seen. Breast biomarker studies will be deferred and
performed on the wide excision specimen.

Pathology results are CONCORDANT with imaging findings, per Dr.
Carmencita Maritza.

Pathology results were discussed with patient via telephone. The
patient reported doing well after the biopsy with tenderness at the
site. Post biopsy care instructions were reviewed and questions were
answered. The patient was encouraged to call [HOSPITAL]
for any additional concerns.

Recommendation: Surgical referral. Request for surgical referral was
relayed to nurse navigators at [HOSPITAL] [HOSPITAL] by
Inocencio Bucci RN on 07/24/2019.

Addendum by Inocencio Bucci RN on 07/25/2019.



Using sterile technique and 1% Lidocaine as local anesthetic, under
stereotactic guidance, a 9 gauge vacuum assisted device was used to
perform core needle biopsy of the targeted microcalcifications over
the left upper outer quadrant using a lateral to medial approach.
Specimen radiograph was performed showing multiple of the targeted
microcalcifications. Specimens with calcifications are identified
for pathology.

Lesion quadrant: Left upper outer quadrant.

At the conclusion of the procedure, a X shaped tissue marker clip
was deployed into the biopsy cavity. Follow-up 2-view mammogram was
performed and dictated separately.
IMPRESSION: Stereotactic-guided biopsy of indeterminate left breast
microcalcifications. No apparent complications.
